# Patient Record
Sex: Female | Born: 1960 | Hispanic: Yes | Marital: Single | Smoking: Never smoker
Health system: Southern US, Community
[De-identification: ages and names within clinical notes are randomized; demographics above are authoritative.]

## PROBLEM LIST (undated history)

## (undated) DIAGNOSIS — C801 Malignant (primary) neoplasm, unspecified: Secondary | ICD-10-CM

## (undated) DIAGNOSIS — C50919 Malignant neoplasm of unspecified site of unspecified female breast: Secondary | ICD-10-CM

## (undated) DIAGNOSIS — E119 Type 2 diabetes mellitus without complications: Secondary | ICD-10-CM

## (undated) HISTORY — PX: PORTACATH PLACEMENT: SHX2246

## (undated) HISTORY — PX: BLADDER SUSPENSION: SHX72

## (undated) HISTORY — PX: MASTECTOMY: SHX3

---

## 2021-05-24 ENCOUNTER — Encounter: Payer: Self-pay | Admitting: Emergency Medicine

## 2021-05-24 ENCOUNTER — Emergency Department
Admission: EM | Admit: 2021-05-24 | Discharge: 2021-05-24 | Disposition: A | Discharge status: Home / Self Care | Payer: Medicaid - Out of State | Attending: Emergency Medicine | Admitting: Emergency Medicine

## 2021-05-24 ENCOUNTER — Emergency Department: Payer: Medicaid - Out of State

## 2021-05-24 ENCOUNTER — Other Ambulatory Visit: Payer: Self-pay

## 2021-05-24 DIAGNOSIS — E119 Type 2 diabetes mellitus without complications: Secondary | ICD-10-CM | POA: Insufficient documentation

## 2021-05-24 DIAGNOSIS — C792 Secondary malignant neoplasm of skin: Secondary | ICD-10-CM | POA: Diagnosis not present

## 2021-05-24 DIAGNOSIS — C50919 Malignant neoplasm of unspecified site of unspecified female breast: Secondary | ICD-10-CM | POA: Insufficient documentation

## 2021-05-24 DIAGNOSIS — Z20822 Contact with and (suspected) exposure to covid-19: Secondary | ICD-10-CM | POA: Diagnosis not present

## 2021-05-24 HISTORY — DX: Malignant neoplasm of unspecified site of unspecified female breast: C50.919

## 2021-05-24 HISTORY — DX: Malignant (primary) neoplasm, unspecified: C80.1

## 2021-05-24 HISTORY — DX: Type 2 diabetes mellitus without complications: E11.9

## 2021-05-24 LAB — CBC WITH DIFFERENTIAL/PLATELET
Abs Immature Granulocytes: 0.03 10*3/uL (ref 0.00–0.07)
Basophils Absolute: 0 10*3/uL (ref 0.0–0.1)
Basophils Relative: 0 %
Eosinophils Absolute: 0.2 10*3/uL (ref 0.0–0.5)
Eosinophils Relative: 2 %
HCT: 37.6 % (ref 36.0–46.0)
Hemoglobin: 11.6 g/dL — ABNORMAL LOW (ref 12.0–15.0)
Immature Granulocytes: 0 %
Lymphocytes Relative: 30 %
Lymphs Abs: 2.3 10*3/uL (ref 0.7–4.0)
MCH: 26.5 pg (ref 26.0–34.0)
MCHC: 30.9 g/dL (ref 30.0–36.0)
MCV: 86 fL (ref 80.0–100.0)
Monocytes Absolute: 0.6 10*3/uL (ref 0.1–1.0)
Monocytes Relative: 7 %
Neutro Abs: 4.7 10*3/uL (ref 1.7–7.7)
Neutrophils Relative %: 61 %
Platelets: 265 10*3/uL (ref 150–400)
RBC: 4.37 MIL/uL (ref 3.87–5.11)
RDW: 13.2 % (ref 11.5–15.5)
WBC: 7.7 10*3/uL (ref 4.0–10.5)
nRBC: 0 % (ref 0.0–0.2)

## 2021-05-24 LAB — BASIC METABOLIC PANEL WITH GFR
Anion gap: 9 (ref 5–15)
BUN: 12 mg/dL (ref 6–20)
CO2: 27 mmol/L (ref 22–32)
Calcium: 9.2 mg/dL (ref 8.9–10.3)
Chloride: 98 mmol/L (ref 98–111)
Creatinine, Ser: 0.63 mg/dL (ref 0.44–1.00)
GFR, Estimated: >60 mL/min
Glucose, Bld: 141 mg/dL — ABNORMAL HIGH (ref 70–99)
Potassium: 3.9 mmol/L (ref 3.5–5.1)
Sodium: 134 mmol/L — ABNORMAL LOW (ref 135–145)

## 2021-05-24 LAB — RESP PANEL BY RT-PCR (FLU A&B, COVID) ARPGX2
Influenza A by PCR: NEGATIVE
Influenza B by PCR: NEGATIVE
SARS Coronavirus 2 by RT PCR: NEGATIVE

## 2021-05-24 LAB — LACTIC ACID, PLASMA: Lactic Acid, Venous: 1.1 mmol/L (ref 0.5–1.9)

## 2021-05-24 MED ORDER — HYDROCODONE-ACETAMINOPHEN 5-325 MG PO TABS
1.0000 | ORAL_TABLET | Freq: Once | ORAL | Status: AC
  Administered 2021-05-24: 15:00:00 1 via ORAL
  Filled 2021-05-24: qty 1

## 2021-05-24 MED ORDER — IOHEXOL 300 MG/ML  SOLN
80.0000 mL | Freq: Once | INTRAMUSCULAR | Status: AC | PRN
  Administered 2021-05-24: 16:00:00 80 mL via INTRAVENOUS
  Filled 2021-05-24: qty 80

## 2021-05-24 MED ORDER — ONDANSETRON 4 MG PO TBDP
4.0000 mg | ORAL_TABLET | Freq: Once | ORAL | Status: AC
  Administered 2021-05-24: 15:00:00 4 mg via ORAL
  Filled 2021-05-24: qty 1

## 2021-05-24 MED ORDER — SULFAMETHOXAZOLE-TRIMETHOPRIM 800-160 MG PO TABS
2.0000 | ORAL_TABLET | Freq: Once | ORAL | Status: AC
  Administered 2021-05-24: 16:00:00 2 via ORAL
  Filled 2021-05-24: qty 2

## 2021-05-24 MED ORDER — HYDROCODONE-ACETAMINOPHEN 5-325 MG PO TABS: 1.0000 | ORAL_TABLET | Freq: Three times a day (TID) | ORAL | 0 refills | Status: AC | PRN

## 2021-05-24 NOTE — ED Notes (Signed)
23 yof c/c of multiple abscesses under left arm and left pectoral area for 3 days. Pt needs translator.   ?

## 2021-05-24 NOTE — ED Triage Notes (Signed)
Pt comes into the ED via POV c/o abscess to the left chest wall area.  Pt states that she has been getting wounds and abcess over her mastectomy site for about 3 months now.  Pt's PAC is on the other side of the chest wall away from the current abscesses.  Pt has large abscess noted to the left upper side of the chest and near the axilla. PT just moved from [place] to here in [place] so she has not been treated for this so far.  Pt denies any fevers thus far.  PT in NAD at this time with even and unlabored respirations.  ?

## 2021-05-24 NOTE — ED Notes (Signed)
Pt NAD, a/ox4. Pt verbalizes understanding of all DC and f/u instructions. All questions answered. Pt walks with steady gait to lobby at DC.  ? ?

## 2021-05-24 NOTE — ED Provider Notes (Signed)
[facility] Emergency Department Provider Note     Event Date/Time   First MD Initiated Contact with Patient 05/24/21 1147     (approximate)   History   Abscess  History limited by Spanish language.  Interpreter used for interview and exam.  HPI  Joyce Sullivan is a 61 y.o. female with a history of breast cancer, diabetes, presents to the ED for evaluation of what she believes are several chest wall abscesses.  Patient is status post radical mastectomy, with Port-A-Cath placement, and notes she has had intermittent abscess formation over the last 3-4 months.  She reports her radical left mastectomy in Dec 2020. She had chemoradiation therapy which were postponed 5 months ago, just before the relocated to the area. Patient presents now with a large abscesses to the left upper chest and LUE.  She denies any fevers, chills, weight loss, or sweats. She does endorse increased pain and heaviness to her LUE (due to lymphedema). Patient recently located from [place] to the area, and has not been evaluated by any local providers for this complaint or routine medical care.     Physical Exam   Triage Vital Signs: ED Triage Vitals  Enc Vitals Group     BP 05/24/21 1004 134/72     Pulse Rate 05/24/21 1004 93     Resp 05/24/21 1004 18     Temp 05/24/21 1004 98.3 F (36.8 C)     Temp Source 05/24/21 1004 Oral     SpO2 05/24/21 1004 95 %     Weight 05/24/21 1012 220 lb (99.8 kg)     Height 05/24/21 1012 '5\' 3"'$  (1.6 m)     Head Circumference --      Peak Flow --      Pain Score 05/24/21 1005 8     Pain Loc --      Pain Edu? --      Excl. in [place]? --     Most recent vital signs: Vitals:   05/24/21 1333 05/24/21 1850  BP: 123/60 132/68  Pulse: 75 77  Resp: 20 20  Temp:  98.5 F (36.9 C)  SpO2: 98% 98%    General Awake, no distress.  CV:  Good peripheral perfusion.  RESP:  Normal effort. CTA ABD:  No distention.  SKIN:  Several large,  pointing, firm, non-mobile lesions over the left mastectomy scar and upper, anterior left arm. No pointing, fluctuance, or spontaneous drainage noted. Palpable, nontender left supraclavicular lymphadenopathy noted. No surgical scar dehiscence noted.  MSK:  LUE with lymphedema noted from the axilla to the hand   ED Results / Procedures / Treatments   Labs (all labs ordered are listed, but only abnormal results are displayed) Labs Reviewed  BASIC METABOLIC PANEL - Abnormal; Notable for the following components:      Result Value   Sodium 134 (*)    Glucose, Bld 141 (*)    All other components within normal limits  CBC WITH DIFFERENTIAL/PLATELET - Abnormal; Notable for the following components:   Hemoglobin 11.6 (*)    All other components within normal limits  RESP PANEL BY RT-PCR (FLU A&B, COVID) ARPGX2  CULTURE, BLOOD (ROUTINE X 2)  CULTURE, BLOOD (ROUTINE X 2)  LACTIC ACID, PLASMA  CANCER ANTIGEN 27.29     EKG   RADIOLOGY  I personally viewed and evaluated these images as part of my medical decision making, as well as reviewing the written report by the radiologist.  ED Provider Interpretation: multiple lesions concerning for metastatic disease Results called to me by Dr. Jobe Igo  CT Chest W Contrast  Result Date: 05/24/2021 CLINICAL DATA:  Soft tissue mass along the chest wall the site of the right mastectomy. Patient states she has an abscess. Oncology care has been out of state. Patient is new to [place]. EXAM: CT CHEST WITH CONTRAST TECHNIQUE: Multidetector CT imaging of the chest was performed during intravenous contrast administration. RADIATION DOSE REDUCTION: This exam was performed according to the departmental dose-optimization program which includes automated exposure control, adjustment of the mA and/or kV according to patient size and/or use of iterative reconstruction technique. CONTRAST:  57m OMNIPAQUE IOHEXOL 300 MG/ML  SOLN COMPARISON:  None. FINDINGS:  Cardiovascular: Heart size is normal. Atherosclerotic calcifications are present at the arch great vessel origins without aneurysm or focal stenosis. Coronary artery calcifications are present as well. A right IJ Port-A-Cath is in place. Mediastinum/Nodes: Enlarged right axillary lymph nodes measure up to 3.6 x 2.5 x 1.0 cm. No significant mediastinal or axis scratched at no significant mediastinal or hilar nodes are present. Left axillary scarring is present. A soft tissue mass measures 3.5 x 2.0 x 1.7 cm. Lungs/Pleura: Lungs are clear. No nodule or mass lesion. No evidence for metastatic disease to the lungs. No significant pleural effusion or pneumothorax. A pleural cyst is noted along the superior left mediastinum. Upper Abdomen: Calcified and noncalcified gallstones are present. Calcified stone measures up to 2.6 cm. Noncalcified stone measures up to 4.2 cm. No significant inflammatory changes are present about the gallbladder. Fatty infiltration liver is noted. Benign cyst extends posteriorly from the left kidney measuring 3.3 cm. No significant adenopathy is present. No other solid organ lesions are present. Musculoskeletal: Extensive cutaneous mass is present at the surgical site. A heterogeneous nodule at the inferior medial aspect of the operative site measures 3.0 x 2.1 cm. Mass in the inferolateral aspect of the operative site measures 3.8 x 3.2 x 3.8 cm. Multiple smaller cutaneous and subcutaneous nodules are present throughout the operative site. Two large nodules scratched at 2 prominent masses extend into the skin surface anterior to the shoulder. The more medial measures 5.0 x 4.9 x 5.0 cm. The more lateral lesion measures 5.5 x 4.5 x 5.2 cm. Tumor infiltration noted into the left pectoralis major and pectoralis minor muscles as well as the rotator cuff. Supraclavicular left-sided lymph nodes measure up to 1.5 cm. IMPRESSION: 1. Extensive cutaneous and subcutaneous nodules at the surgical site  consistent with metastatic disease. 2. Tumor infiltration of the left pectoralis major and pectoralis minor muscles as well as the rotator cuff. 3. Enlarged right axillary lymph nodes are consistent with metastatic disease. 4. Supraclavicular left-sided lymph nodes measure up to 1.5 cm. 5. Coronary artery disease. 6. Cholelithiasis without evidence for cholecystitis. 7. Fatty infiltration of the liver. 8. Benign cyst extends posteriorly from the left kidney. Electronically Signed   By: CSan MorelleM.D.   On: 05/24/2021 17:17   CT HUMERUS LEFT W CONTRAST  Result Date: 05/24/2021 CLINICAL DATA:  Soft tissue masses in the left chest wall and upper arm EXAM: CT OF THE UPPER LEFT EXTREMITY WITH CONTRAST TECHNIQUE: Multidetector CT imaging of the left humerus was performed according to the standard protocol following intravenous contrast administration. RADIATION DOSE REDUCTION: This exam was performed according to the departmental dose-optimization program which includes automated exposure control, adjustment of the mA and/or kV according to patient size and/or use of iterative reconstruction technique. CONTRAST:  47m OMNIPAQUE IOHEXOL 300 MG/ML  SOLN COMPARISON:  CT chest 05/24/2021 FINDINGS: Bones/Joint/Cartilage No significant lesion of the humerus or adjacent bony structures identified. No definite glenohumeral joint effusion. Ligaments Suboptimally assessed by CT. Muscles and Tendons Thickening of the left pectoralis major muscle with infiltrative edema surrounding the pectoralis major as well as nodularity particularly along the anterior margin of the pectoralis major muscle, likely abutting the superficial fascia margin and with early muscular invasion not excluded. Subcutaneous nodularity also substantially abuts the anterior superficial fascia margin of the left anterior deltoid muscle as on image 62 series one. Soft tissues Extensive subcutaneous nodularity and subcutaneous masses anterior to the  left pectoralis muscle and left deltoid muscle along with masses in the left supraclavicular and left infraclavicular regions, along the left axilla, along the mastectomy site in the left chest, and with subcutaneous nodules along the superficial fascia margin of the left lateral deltoid and left triceps musculature. Index left infraclavicular soft tissue mass 4.8 by 2.9 cm on image 31 series 1, this partially abuts neurovascular structures along the axilla. Index subcutaneous mass anterior to the lateral pectoralis major muscle, 4.9 by 4.6 cm on image 52 series 1. Conglomerate nodularity anterior to the left pectoralis musculature on image 98 of series 1 measures 8.9 by 3.4 cm. A subcutaneous nodule adjacent to the left lateral triceps muscle measures 2.2 by 1.2 cm on image 92 series 1. Supraclavicular nodularity in the soft tissues above the left shoulder is noted with 1 of the nodules measuring 1.8 by 1.3 cm on image 6 series 1. Substantial additional nodules are present. Assessment of the lungs is deferred to the chest CT. There appears to be truncation of the left axillary vein in the region of some clustered clips in the left axilla, with poor definition and small caliber of venous tributaries distal to these clips. IMPRESSION: 1. Substantial subcutaneous masses along the mastectomy site in the chest, as well as in the left axilla, left shoulder region, and subcutaneous tissues overlying the left deltoid and left triceps musculature. Appearance compatible with active malignancy. 2. Truncation of the left axillary vein in the region of some clustered clips in the left axilla, with poorly defined smaller caliber venous tributaries distal to these clips. Electronically Signed   By: WVan ClinesM.D.   On: 05/24/2021 17:11     PROCEDURES:  Critical Care performed: No  Procedures   MEDICATIONS ORDERED IN ED: Medications  HYDROcodone-acetaminophen (NORCO/VICODIN) 5-325 MG per tablet 1 tablet (1  tablet Oral Given 05/24/21 1442)  ondansetron (ZOFRAN-ODT) disintegrating tablet 4 mg (4 mg Oral Given 05/24/21 1442)  sulfamethoxazole-trimethoprim (BACTRIM DS) 800-160 MG per tablet 2 tablet (2 tablets Oral Given 05/24/21 1541)  iohexol (OMNIPAQUE) 300 MG/ML solution 80 mL (80 mLs Intravenous Contrast Given 05/24/21 1625)     IMPRESSION / MDM / ASSESSMENT AND PLAN / ED COURSE  I reviewed the triage vital signs and the nursing notes.                              Differential diagnosis includes, but is not limited to, abscess, cellulitis, metastatic disease  ----------------------------------------- 5:24 PM on 05/24/2021 ----------------------------------------- Spoke with Dr. FGrayland Ormondabout the case.  He reviewed the images, and suggested additional imaging for cancer staging as well as an ultrasound-guided chest wall biopsy.  His initial recommendation was that the patient be admitted to the hospital service for additional work-up tomorrow.  Patient ultimately declined  admission offer at this time, requesting instead for outpatient work-up.  Dr. Grayland Ormond agreed with the patient's decision, citing that he would schedule all remaining testing and imaging through his office, and see the patient in his clinic on Tuesday.  Patient's diagnosis is consistent with metastatic breast cancer disease to the soft tissues of the chest and upper extremity. Patient will be discharged home with prescriptions for hydrocodone for pain. Patient is to follow up with [facility] as scheduled or otherwise directed. Patient is given ED precautions to return to the ED for any worsening or new symptoms.     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    Melvenia Needles, PA-C 05/24/21 1907    Harvest Dark, MD 05/28/21 1429

## 2021-05-24 NOTE — Discharge Instructions (Addendum)
Your skin lesions appear to be related to your cancer  history. You should follow-up with Dr. Grayland Ormond at the [facility], here at [facility]. A nurse from his office will call you tomorrow, and schedule a time to report to his office on Tuesday, March 14. Take the pain medicine as needed. Return to the Emergency Department if needed.  ? ?Sus lesiones en la piel parecen estar relacionadas con su historial de c?ncer. Debe hacer un seguimiento con el Dr. Grayland Ormond en [place], aqu? en [place] de su oficina lo llamar? ma?ana y programar? un horario para presentarse en su oficina el martes 14 de [place]. [place] el medicamento para el dolor seg?n sea necesario. Regrese al [employer] [facility] si es necesario. ?

## 2021-05-25 ENCOUNTER — Telehealth: Payer: Self-pay

## 2021-05-25 ENCOUNTER — Other Ambulatory Visit: Payer: Self-pay | Admitting: Oncology

## 2021-05-25 DIAGNOSIS — C50919 Malignant neoplasm of unspecified site of unspecified female breast: Secondary | ICD-10-CM

## 2021-05-25 NOTE — Telephone Encounter (Signed)
Per [place] message from Dr. Grayland Ormond: New referral from the ED which looks like a recurrence of her breast cancer.  She is from [place] and recently moved to [facility].  We will need to get her old records from where she was treated previously.  I need to see her next week would be great if we could get a PET scan prior to that visit.  I believe she is a Spanish-speaking only.  I asked the ED to tell her to expect a call from [place] today.   ?

## 2021-05-25 NOTE — Telephone Encounter (Signed)
Called and spoke to patient informed her of PET scan at [facility] on tues 3/14 and consult appt with MD on 3/16. Pt verbalized understanding and wrote down appt details and address. Pt also states that she was previously seen in [facility] in [place] ([address]), [place]. Informed her that we will get her to sign release of info when she is here on Thurs so records can be requested, she agreed to sign consent.  ? ?Tiffany/ Nikki, please have patient sign release of info form when she is here for appt and fax request to [facility]. Thanks.  ?

## 2021-05-26 LAB — CANCER ANTIGEN 27.29: CA 27.29: 15.6 U/mL (ref 0.0–38.6)

## 2021-05-29 ENCOUNTER — Ambulatory Visit (HOSPITAL_COMMUNITY): Admission: RE | Admit: 2021-05-29 | Payer: Self-pay | Source: Ambulatory Visit

## 2021-05-29 LAB — CULTURE, BLOOD (ROUTINE X 2)
Culture: NO GROWTH
Culture: NO GROWTH
Special Requests: ADEQUATE
Special Requests: ADEQUATE

## 2021-05-31 ENCOUNTER — Telehealth: Payer: Self-pay

## 2021-05-31 ENCOUNTER — Inpatient Hospital Stay: Payer: PRIVATE HEALTH INSURANCE

## 2021-05-31 ENCOUNTER — Inpatient Hospital Stay: Payer: PRIVATE HEALTH INSURANCE | Admitting: Oncology

## 2021-05-31 NOTE — Telephone Encounter (Signed)
Pt no showed to PET on 3/14 and to MD appt on 3/16. Attempted to contact pt x3 today to follow up, but there was no answer.  ?

## 2023-03-04 IMAGING — CT CT CHEST W/ CM
2 of 4 series · 14 of 36 positions shown, 17 images · IV contrast (APPLIED)
Comparison: None.

CLINICAL DATA: Soft tissue mass along the chest wall the site of
the right mastectomy. Patient states she has an abscess. Oncology
care has been out of state. Patient is new to [place] [HOSPITAL].

EXAM:
CT CHEST WITH CONTRAST
TECHNIQUE: Multidetector CT imaging of the chest was performed during
intravenous contrast administration.

[Series 2: chest w · axial · 0.98mm/px · z∈[-736,-378]mm · 11 of 207 slices shown, 14 images]
[im 14/207  mediastinal]
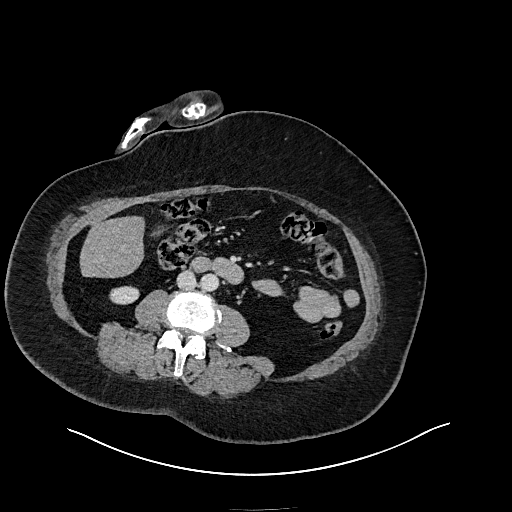
[im 14/207  lung]
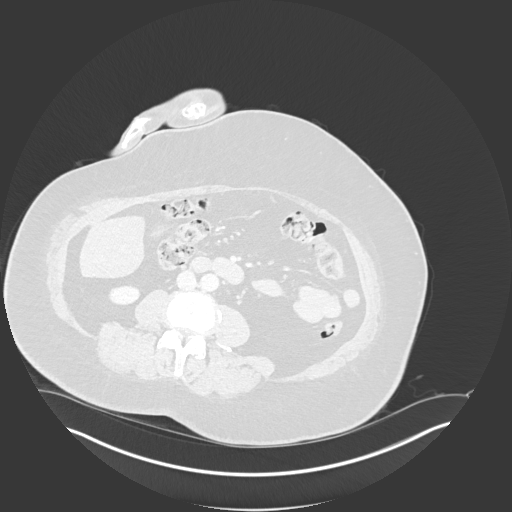
[im 28/207  lung]
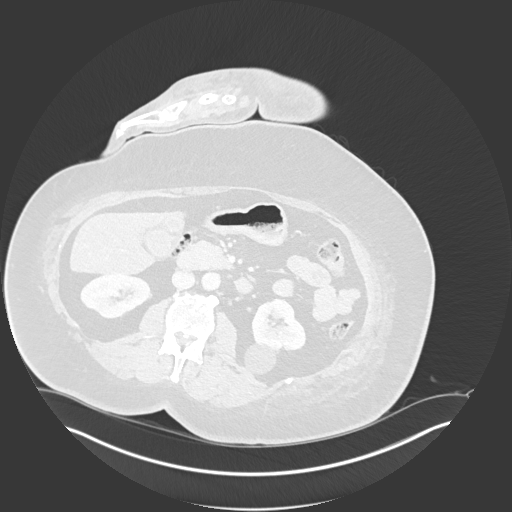
[im 55/207  lung]
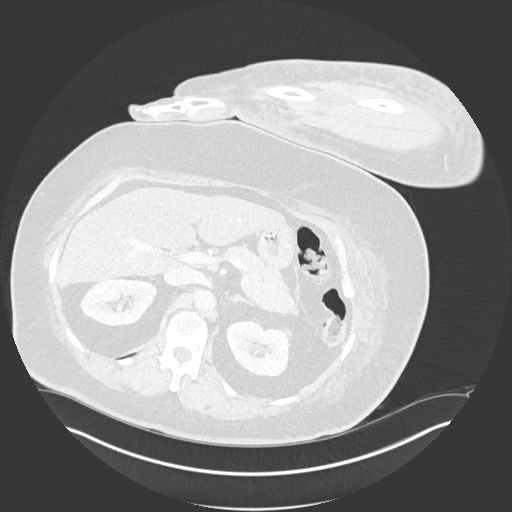
[im 69/207  lung]
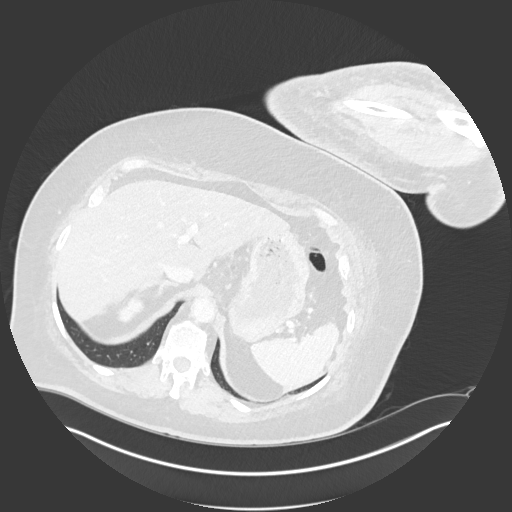
[im 83/207  mediastinal]
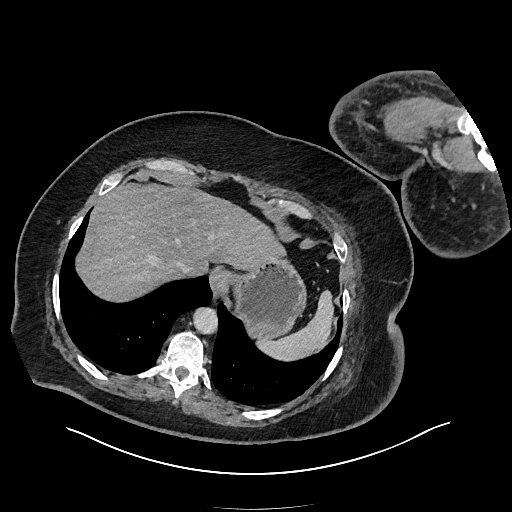
[im 83/207  lung]
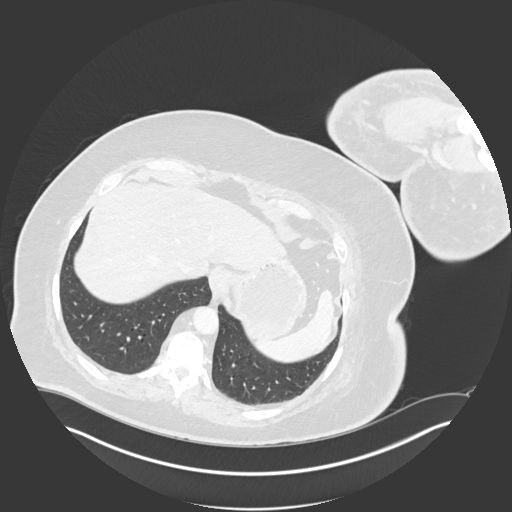
[im 110/207  lung]
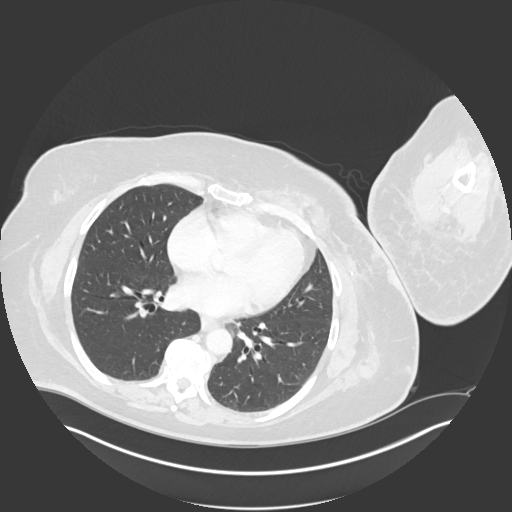
[im 124/207  lung]
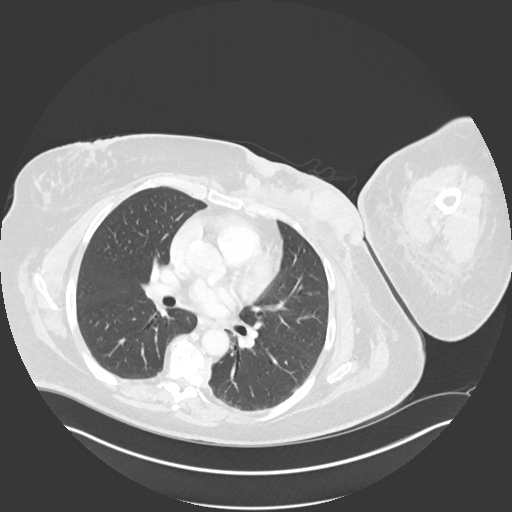
[im 138/207  lung]
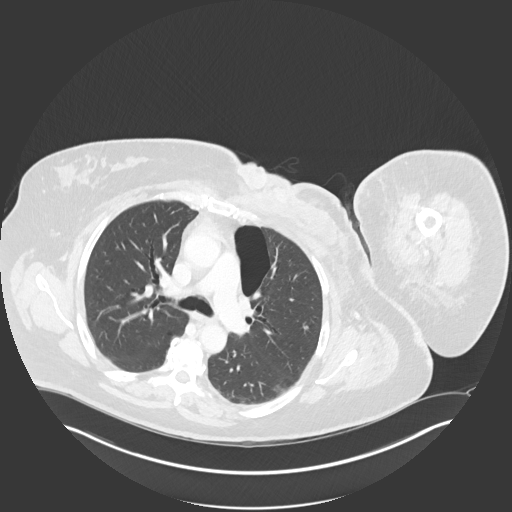
[im 152/207  mediastinal]
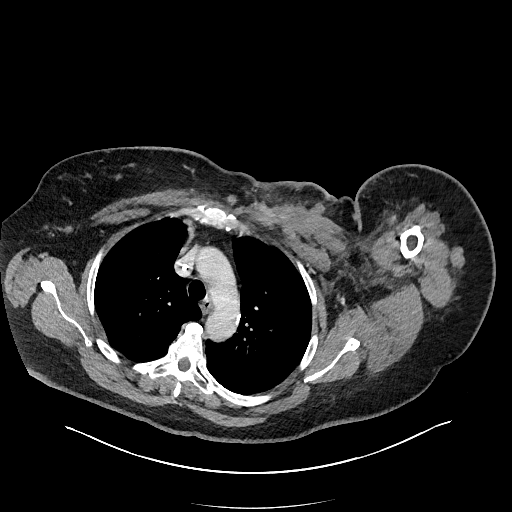
[im 152/207  lung]
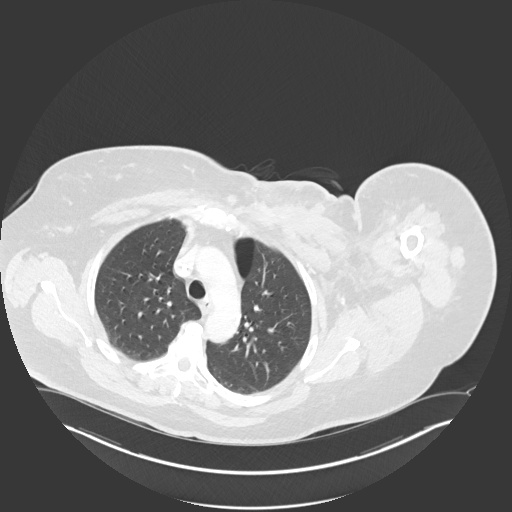
[im 179/207  lung]
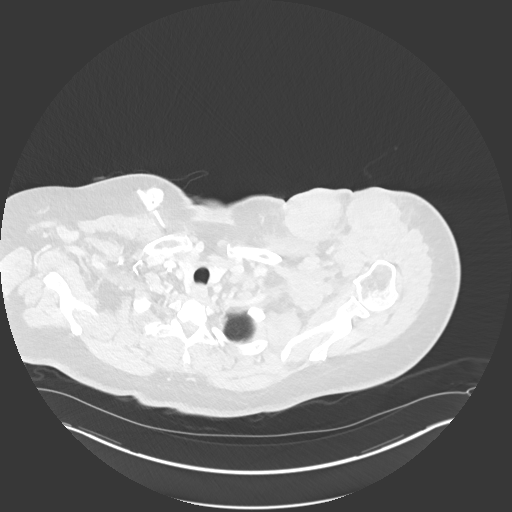
[im 193/207  lung]
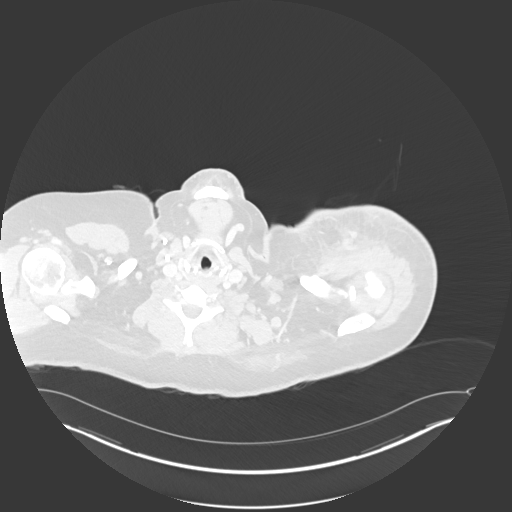

[Series 5: cor · coronal · 0.82mm/px · 3 of 167 slices shown]
[im 34/167  lung]
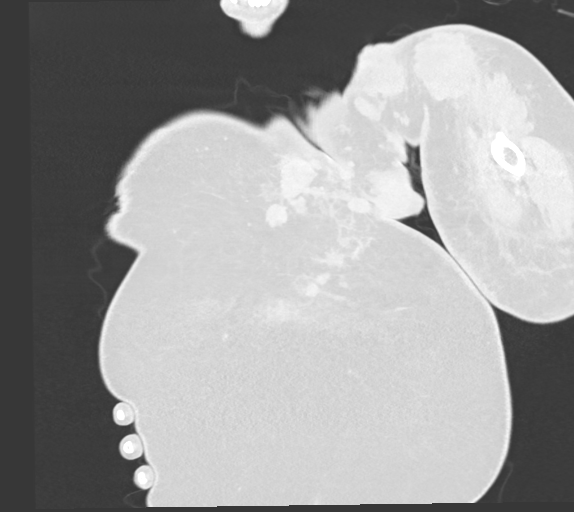
[im 67/167  lung]
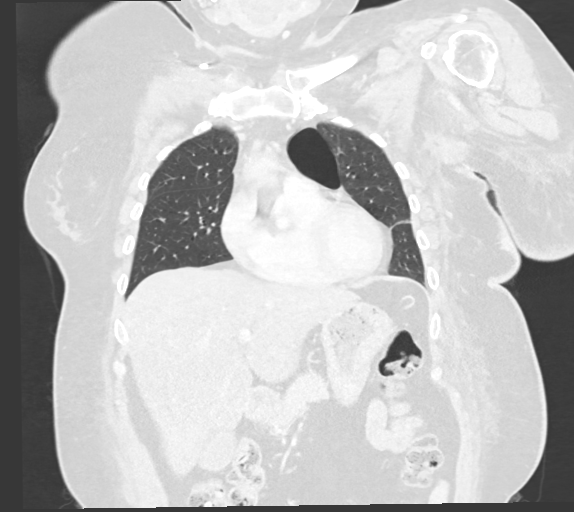
[im 100/167  lung]
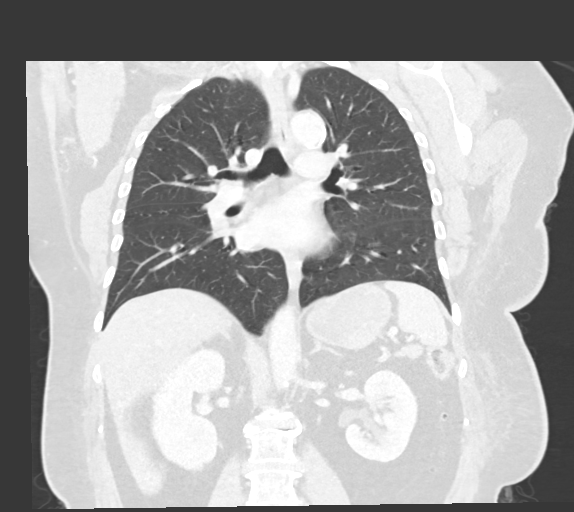

[14 of 36 positions shown; findings below may reference images not displayed]

RADIATION DOSE REDUCTION: This exam was performed according to the
departmental dose-optimization program which includes automated
exposure control, adjustment of the mA and/or kV according to
patient size and/or use of iterative reconstruction technique.

CONTRAST:  80mL OMNIPAQUE IOHEXOL 300 MG/ML  SOLN
FINDINGS: Cardiovascular: Heart size is normal. Atherosclerotic calcifications
are present at the arch great vessel origins without aneurysm or
focal stenosis. Coronary artery calcifications are present as well.
A right IJ Port-A-Cath is in place.

Mediastinum/Nodes: Enlarged right axillary lymph nodes measure up to
3.6 x 2.5 x 1.0 cm. No significant mediastinal or axis scratched at
no significant mediastinal or hilar nodes are present. Left axillary
scarring is present. A soft tissue mass measures 3.5 x 2.0 x 1.7 cm.

Lungs/Pleura: Lungs are clear. No nodule or mass lesion. No evidence
for metastatic disease to the lungs. No significant pleural effusion
or pneumothorax. A pleural cyst is noted along the superior left
mediastinum.

Upper Abdomen: Calcified and noncalcified gallstones are present.
Calcified stone measures up to 2.6 cm. Noncalcified stone measures
up to 4.2 cm. No significant inflammatory changes are present about
the gallbladder. Fatty infiltration liver is noted. Benign cyst
extends posteriorly from the left kidney measuring 3.3 cm. No
significant adenopathy is present. No other solid organ lesions are
present.

Musculoskeletal: Extensive cutaneous mass is present at the surgical
site. A heterogeneous nodule at the inferior medial aspect of the
operative site measures 3.0 x 2.1 cm. Mass in the inferolateral
aspect of the operative site measures 3.8 x 3.2 x 3.8 cm. Multiple
smaller cutaneous and subcutaneous nodules are present throughout
the operative site. Two large nodules scratched at 2 prominent
masses extend into the skin surface anterior to the shoulder. The
more medial measures 5.0 x 4.9 x 5.0 cm. The more lateral lesion
measures 5.5 x 4.5 x 5.2 cm.

Tumor infiltration noted into the left pectoralis major and
pectoralis minor muscles as well as the rotator cuff.

Supraclavicular left-sided lymph nodes measure up to 1.5 cm.
IMPRESSION: 1. Extensive cutaneous and subcutaneous nodules at the surgical site
consistent with metastatic disease.
2. Tumor infiltration of the left pectoralis major and pectoralis
minor muscles as well as the rotator cuff.
3. Enlarged right axillary lymph nodes are consistent with
metastatic disease.
4. Supraclavicular left-sided lymph nodes measure up to 1.5 cm.
5. Coronary artery disease.
6. Cholelithiasis without evidence for cholecystitis.
7. Fatty infiltration of the liver.
8. Benign cyst extends posteriorly from the left kidney.
